# Patient Record
Sex: Female | Born: 2000 | Race: Black or African American | Hispanic: No | Marital: Single | Smoking: Never smoker
Health system: Southern US, Community
[De-identification: ages and names within clinical notes are randomized; demographics above are authoritative.]

## PROBLEM LIST (undated history)

## (undated) DIAGNOSIS — E669 Obesity, unspecified: Secondary | ICD-10-CM

## (undated) DIAGNOSIS — F909 Attention-deficit hyperactivity disorder, unspecified type: Secondary | ICD-10-CM

## (undated) DIAGNOSIS — R011 Cardiac murmur, unspecified: Secondary | ICD-10-CM

## (undated) DIAGNOSIS — T7840XA Allergy, unspecified, initial encounter: Secondary | ICD-10-CM

## (undated) DIAGNOSIS — L309 Dermatitis, unspecified: Secondary | ICD-10-CM

## (undated) HISTORY — PX: OTHER SURGICAL HISTORY: SHX169

## (undated) HISTORY — PX: ADENOIDECTOMY: SUR15

## (undated) HISTORY — PX: TONSILLECTOMY: SUR1361

## (undated) HISTORY — PX: INNER EAR SURGERY: SHX679

---
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
## 2021-01-03 ENCOUNTER — Emergency Department
Admission: EM | Admit: 2021-01-03 | Discharge: 2021-01-03 | Disposition: A | Discharge status: Home / Self Care | Payer: Medicaid Other | Attending: Emergency Medicine | Admitting: Emergency Medicine

## 2021-01-03 ENCOUNTER — Other Ambulatory Visit: Payer: Self-pay

## 2021-01-03 ENCOUNTER — Encounter: Payer: Self-pay | Admitting: Emergency Medicine

## 2021-01-03 DIAGNOSIS — H60333 Swimmer's ear, bilateral: Secondary | ICD-10-CM | POA: Insufficient documentation

## 2021-01-03 DIAGNOSIS — H9203 Otalgia, bilateral: Secondary | ICD-10-CM | POA: Diagnosis present

## 2021-01-03 DIAGNOSIS — H66011 Acute suppurative otitis media with spontaneous rupture of ear drum, right ear: Secondary | ICD-10-CM | POA: Diagnosis not present

## 2021-01-03 DIAGNOSIS — Z79899 Other long term (current) drug therapy: Secondary | ICD-10-CM | POA: Diagnosis not present

## 2021-01-03 DIAGNOSIS — I10 Essential (primary) hypertension: Secondary | ICD-10-CM | POA: Diagnosis not present

## 2021-01-03 DIAGNOSIS — J029 Acute pharyngitis, unspecified: Secondary | ICD-10-CM | POA: Diagnosis not present

## 2021-01-03 MED ORDER — CIPROFLOXACIN-DEXAMETHASONE 0.3-0.1 % OT SUSP: 4.0000 [drp] | Freq: Two times a day (BID) | OTIC | 0 refills | Status: DC

## 2021-01-03 MED ORDER — DEXAMETHASONE 6 MG PO TABS
10.0000 mg | ORAL_TABLET | Freq: Once | ORAL | Status: AC
  Administered 2021-01-03: 10 mg via ORAL
  Filled 2021-01-03: qty 1

## 2021-01-03 MED ORDER — CIPROFLOXACIN-DEXAMETHASONE 0.3-0.1 % OT SUSP: 4.0000 [drp] | Freq: Once | OTIC | Status: DC

## 2021-01-03 MED ORDER — AMOXICILLIN-POT CLAVULANATE 875-125 MG PO TABS: 1.0000 | ORAL_TABLET | Freq: Two times a day (BID) | ORAL | 0 refills | Status: AC

## 2021-01-03 MED ORDER — AMOXICILLIN-POT CLAVULANATE 875-125 MG PO TABS: 1.0000 | ORAL_TABLET | Freq: Two times a day (BID) | ORAL | 0 refills | Status: DC

## 2021-01-03 MED ORDER — CIPROFLOXACIN-DEXAMETHASONE 0.3-0.1 % OT SUSP: 4.0000 [drp] | Freq: Two times a day (BID) | OTIC | 0 refills | Status: AC

## 2021-01-03 MED ORDER — IBUPROFEN 600 MG PO TABS: 600.0000 mg | ORAL_TABLET | Freq: Three times a day (TID) | ORAL | 0 refills | Status: DC | PRN

## 2021-01-03 MED ORDER — IBUPROFEN 600 MG PO TABS: 600.0000 mg | ORAL_TABLET | Freq: Three times a day (TID) | ORAL | 0 refills | Status: AC | PRN

## 2021-01-03 NOTE — ED Provider Notes (Signed)
[facility] Emergency Department Provider Note  ____________________________________________   Event Date/Time   First MD Initiated Contact with Patient 01/03/21 1553     (approximate)  I have reviewed the triage vital signs and the nursing notes.   HISTORY  Chief Complaint Sore Throat, Cough, and Fever    HPI Nicole Cummings is a 20 y.o. female with history of recurrent ear infections here with multiple complaints.  The patient states that for the last several days, she has had sore throat, bilateral ear pain, and some drainage from her right ear.  She said associated cough with occasional yellow-green sputum production.  No shortness of breath.  No fevers.  She states that she has history of recurrent ear infections, and her current ear pressure and drainage feels similar to this.  She has also noticed sore throat and pain with swallowing.  No difficulty swallowing.  She has had a tonsillectomy.  Denies any neck pain or neck stiffness.  No headache or photophobia.    Past Medical History:  Diagnosis Date   ADHD (attention deficit hyperactivity disorder)    Allergy    Eczema    Heart murmur    h/o   Obesity     Patient Active Problem List   Diagnosis Date Noted   Major depressive disorder, recurrent severe without psychotic features (HCC) 12/04/2020   Non-suicidal self harm 01/14/2019   Self-cutting of wrist (HCC)    Suicidal ideation    Essential hypertension 01/19/2015    Past Surgical History:  Procedure Laterality Date   ADENOIDECTOMY     INNER EAR SURGERY     TONSILLECTOMY     tubes in ears      Prior to Admission medications   Medication Sig Start Date End Date Taking? Authorizing Provider  albuterol (VENTOLIN HFA) 108 (90 Base) MCG/ACT inhaler Inhale 1-2 puffs into the lungs every 6 (six) hours as needed for wheezing or shortness of breath. 12/06/20   Jesse Sans, MD  amoxicillin-clavulanate (AUGMENTIN) 875-125 MG tablet  Take 1 tablet by mouth 2 (two) times daily for 10 days. 01/03/21 01/13/21  Shaune Pollack, MD  budesonide-formoterol (SYMBICORT) 160-4.5 MCG/ACT inhaler Inhale 2 puffs into the lungs 2 (two) times daily.    [provider]  ciprofloxacin-dexamethasone (CIPRODEX) OTIC suspension Place 4 drops into both ears 2 (two) times daily for 7 days. 01/03/21 01/10/21  Shaune Pollack, MD  escitalopram (LEXAPRO) 5 MG tablet Take 1 tablet (5 mg total) by mouth at bedtime. 12/06/20   Jesse Sans, MD  ibuprofen (ADVIL) 600 MG tablet Take 1 tablet (600 mg total) by mouth every 8 (eight) hours as needed for moderate pain. 01/03/21   Shaune Pollack, MD  lisinopril (ZESTRIL) 20 MG tablet Take 1 tablet (20 mg total) by mouth every morning. 12/06/20   Jesse Sans, MD    Allergies Shellfish allergy  Family History  Problem Relation Age of Onset   Diabetes Mother    Hypertension Mother     Social History Social History   Tobacco Use   Smoking status: Never   Smokeless tobacco: Never  Vaping Use   Vaping Use: Never used  Substance Use Topics   Alcohol use: Yes   Drug use: Yes    Types: Marijuana    Review of Systems  Review of Systems  Constitutional:  Positive for fatigue. Negative for fever.  HENT:  Positive for congestion, ear discharge, ear pain and rhinorrhea. Negative for sore throat.  Eyes:  Negative for visual disturbance.  Respiratory:  Positive for cough. Negative for shortness of breath.   Cardiovascular:  Negative for chest pain.  Gastrointestinal:  Negative for abdominal pain, diarrhea, nausea and vomiting.  Genitourinary:  Negative for flank pain.  Musculoskeletal:  Negative for back pain and neck pain.  Skin:  Negative for rash and wound.  Neurological:  Negative for weakness.  All other systems reviewed and are negative.   ____________________________________________  PHYSICAL EXAM:      VITAL SIGNS: ED Triage Vitals [01/03/21 1517]  Enc Vitals Group     BP  129/83     Pulse Rate 69     Resp 20     Temp 98.5 F (36.9 C)     Temp Source Oral     SpO2 100 %     Weight (!) 340 lb (154.2 kg)     Height 5\' 6"  (1.676 m)     Head Circumference      Peak Flow      Pain Score 9     Pain Loc      Pain Edu?      Excl. in [place]?      Physical Exam Vitals and nursing note reviewed.  Constitutional:      General: She is not in acute distress.    Appearance: She is well-developed.  HENT:     Head: Normocephalic and atraumatic.     Comments: Right external canal with yellow-green drainage noted and mild erythema.  The visualized tympanic membrane has a likely small perforation, though there also appears to be air-fluid levels behind this.  Moderate erythema noted.  Left tympanic membranes mildly erythematous but intact, and there is some maceration and erythema of the external canal.  No mastoid tenderness or erythema or asymmetry.  History of pharynx erythematous without significant tonsillar swelling.  Uvula is midline.  Sublingual space is soft.  Neck is supple. Eyes:     Conjunctiva/sclera: Conjunctivae normal.  Cardiovascular:     Rate and Rhythm: Normal rate and regular rhythm.     Heart sounds: Normal heart sounds. No murmur heard.   No friction rub.  Pulmonary:     Effort: Pulmonary effort is normal. No respiratory distress.     Breath sounds: Normal breath sounds. No wheezing or rales.  Chest:     Comments: Clear breath sounds bilaterally with normal work of breathing. Abdominal:     General: There is no distension.     Palpations: Abdomen is soft.     Tenderness: There is no abdominal tenderness.  Musculoskeletal:     Cervical back: Neck supple.  Skin:    General: Skin is warm.     Capillary Refill: Capillary refill takes less than 2 seconds.  Neurological:     Mental Status: She is alert and oriented to person, place, and time.     Motor: No abnormal muscle tone.      ____________________________________________   LABS (all  labs ordered are listed, but only abnormal results are displayed)  Labs Reviewed - No data to display  ____________________________________________  EKG:  ________________________________________  RADIOLOGY All imaging, including plain films, CT scans, and ultrasounds, independently reviewed by me, and interpretations confirmed via formal radiology reads.  ED MD interpretation:     Official radiology report(s): No results found.  ____________________________________________  PROCEDURES   Procedure(s) performed (including Critical Care):  Procedures  ____________________________________________  INITIAL IMPRESSION / MDM / ASSESSMENT AND PLAN / ED COURSE  As part of my medical decision making, I reviewed the following data within the [facility] notes reviewed and incorporated, Old chart reviewed, Notes from prior ED visits, and [place] Controlled Substance Database       *Nicole Cummings was evaluated in Emergency Department on 01/03/2021 for the symptoms described in the history of present illness. She was evaluated in the context of the global COVID-19 pandemic, which necessitated consideration that the patient might be at risk for infection with the SARS-CoV-2 virus that causes COVID-19. Institutional protocols and algorithms that pertain to the evaluation of patients at risk for COVID-19 are in a state of rapid change based on information released by regulatory bodies including the CDC and federal and state organizations. These policies and algorithms were followed during the patient's care in the ED.  Some ED evaluations and interventions may be delayed as a result of limited staffing during the pandemic.*     Medical Decision Making: 20 year old female with history of recurrent ear infections and sinus infections here with several days of nasal congestion, sore throat, and bilateral ear pressure with drainage.  Exam is consistent with likely acute  otitis media of at least the right ear, with possible concomitant otitis externa.  There is also some mild maceration and erythema of the left ear canal and I suspect patient uses Q-tips in her ear canals.  No evidence suggest mastoiditis.  No evidence of meningitis or encephalitis.  She is neurologically intact.  Regarding her sore throat, suspect this is related to likely sinusitis and pharyngitis which is contributed to her ear infection.  She is status post tonsillectomy.  No evidence of uvulitis or peritonsillar abscess.  Neck is supple with no evidence of retropharyngeal abscess.  No stridor.  Patient will be treated with a dose of Decadron, NSAIDs, as well as Augmentin and Ciprodex.  Return precautions given.  ____________________________________________  FINAL CLINICAL IMPRESSION(S) / ED DIAGNOSES  Final diagnoses:  Chronic swimmer's ear of both sides  Non-recurrent acute suppurative otitis media of right ear with spontaneous rupture of tympanic membrane  Pharyngitis, unspecified etiology     MEDICATIONS GIVEN DURING THIS VISIT:  Medications  dexamethasone (DECADRON) tablet 10 mg (has no administration in time range)         Note:  This document was prepared using Dragon voice recognition software and may include unintentional dictation errors.   Duffy Bruce, MD 01/03/21 [phone]

## 2021-01-03 NOTE — ED Triage Notes (Signed)
Pt via POV from home. Pt c/o cough, nasal congestion, fever, and sore throat for the past week. Denies exposure to COVID. Denies being tested recently. Pt is A&Ox4 and NAD.

## 2021-01-03 NOTE — ED Notes (Signed)
Patient given discharge instructions, all questions answered. Patient in possession of all belongings, directed to the discharge area  

## 2022-03-22 IMAGING — CR DG CERVICAL SPINE 2 OR 3 VIEWS
1 series · 4 of 4 positions shown · non-contrast
Comparison: None.

CLINICAL DATA: Increased tingling sensation of the right upper
extremity.

EXAM:
CERVICAL SPINE - 2-3 VIEW

[Series 1: dg cervical spine 2 or 3 views · 0.14mm/px · 4 of 4 slices shown]
[im 1/4]
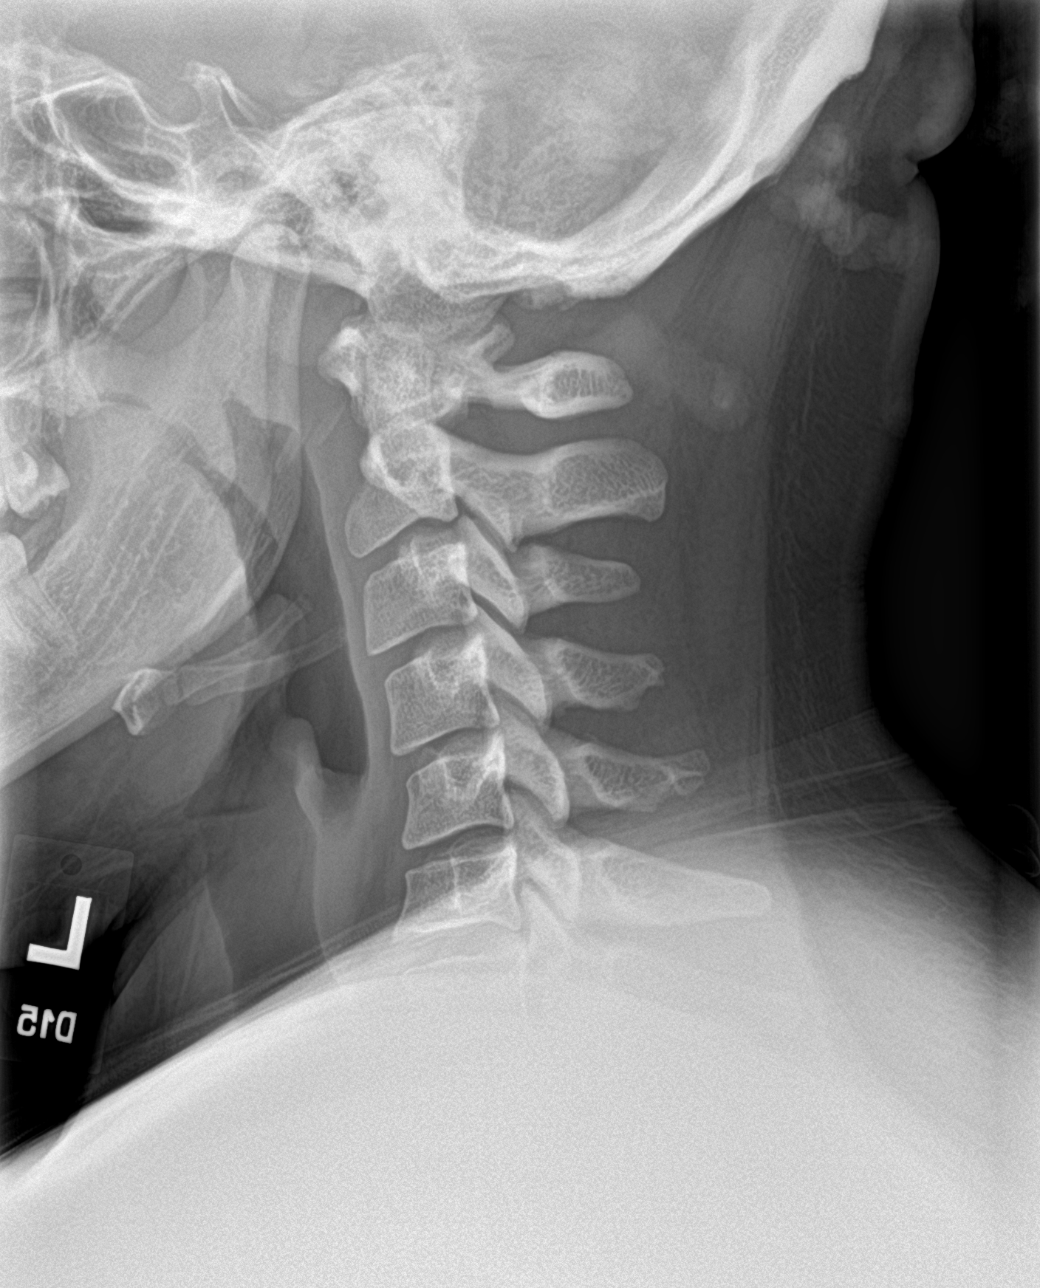
[im 2/4]
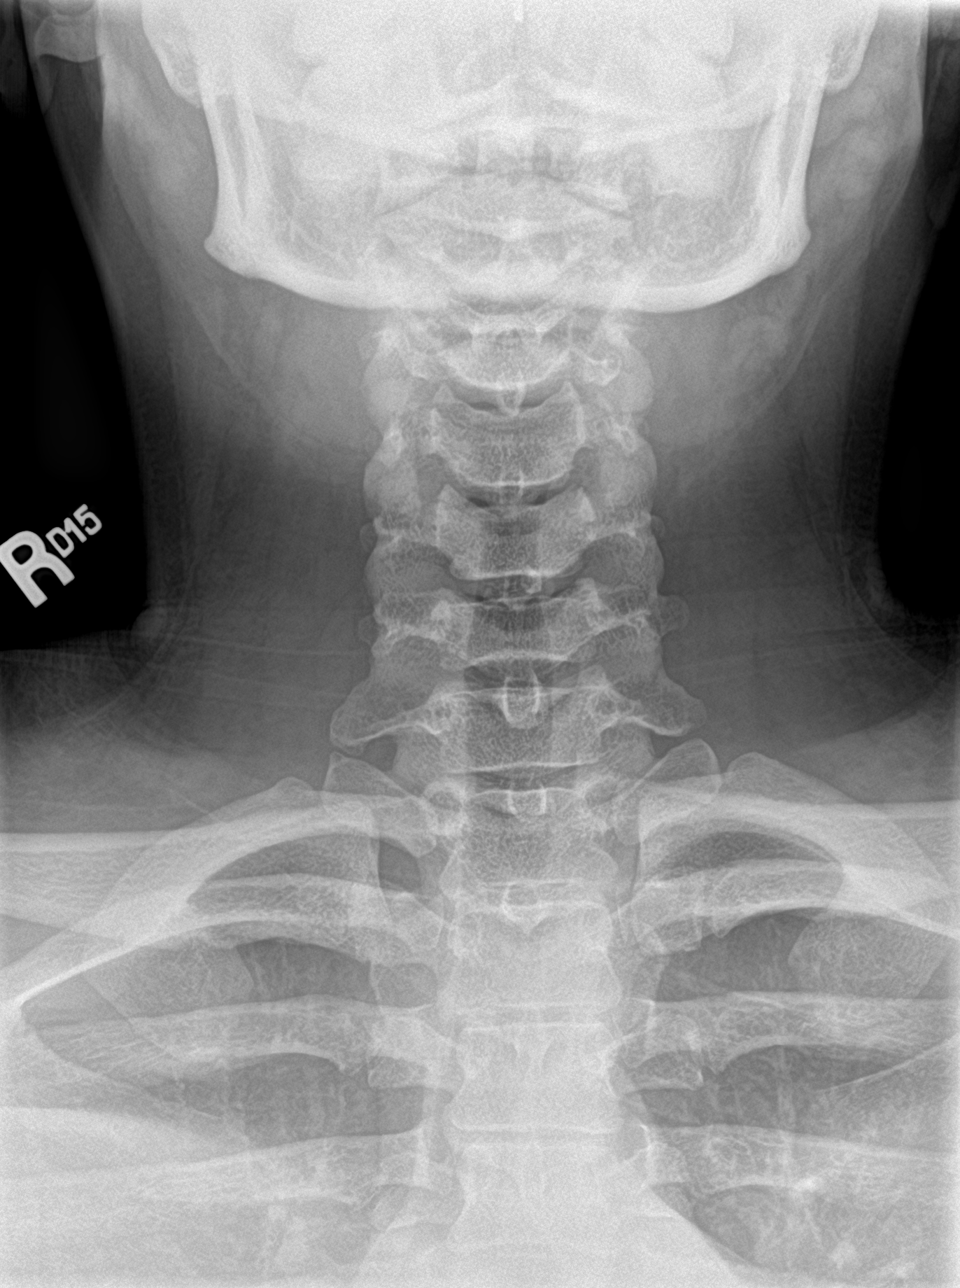
[im 3/4]
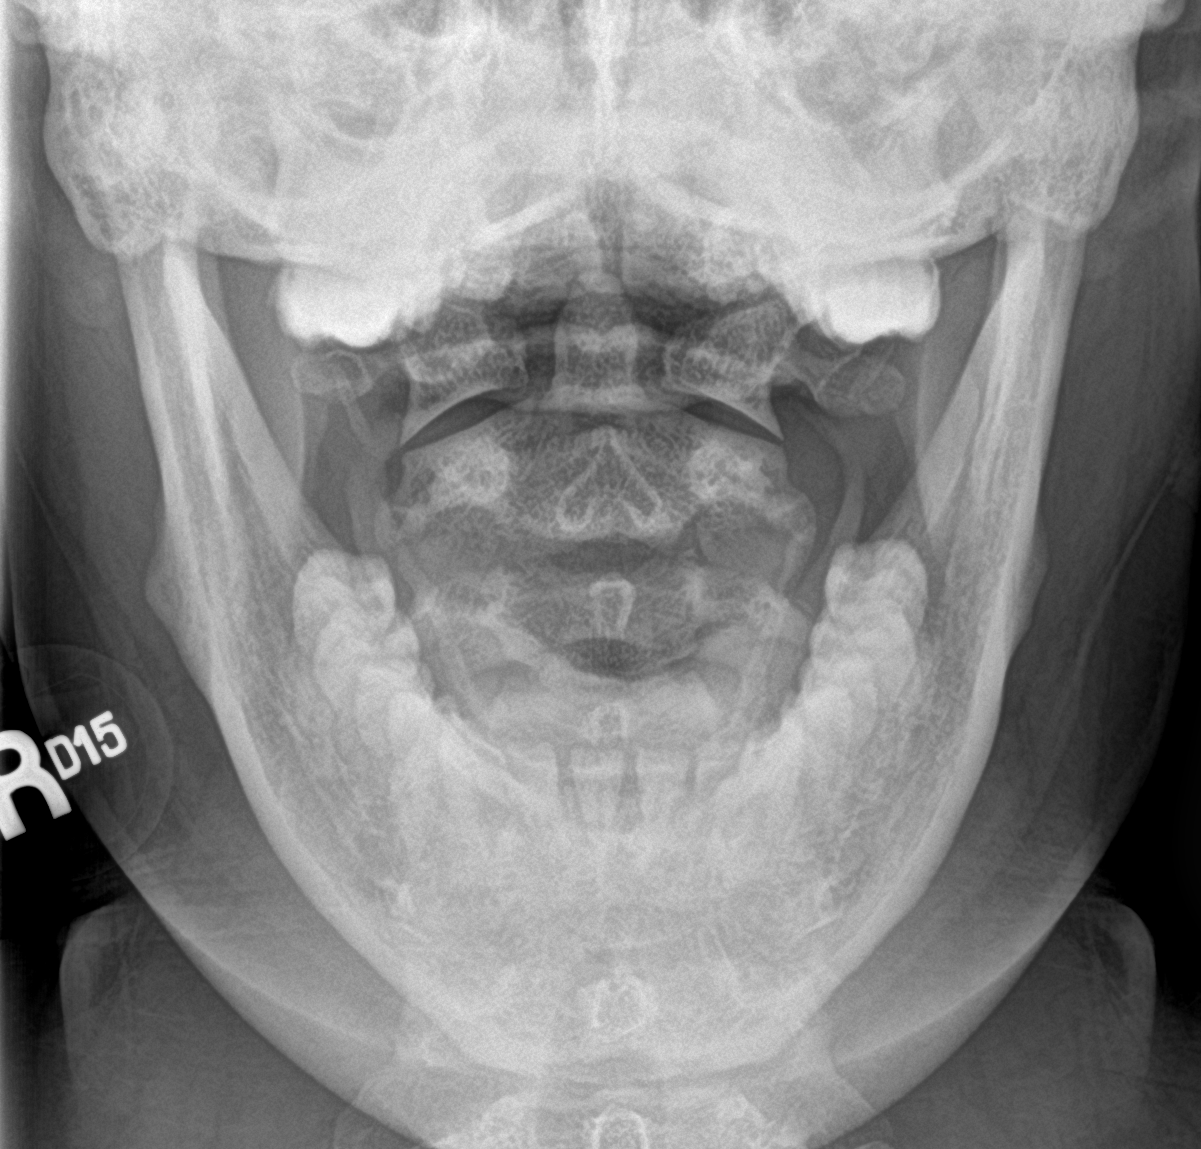
[im 4/4]
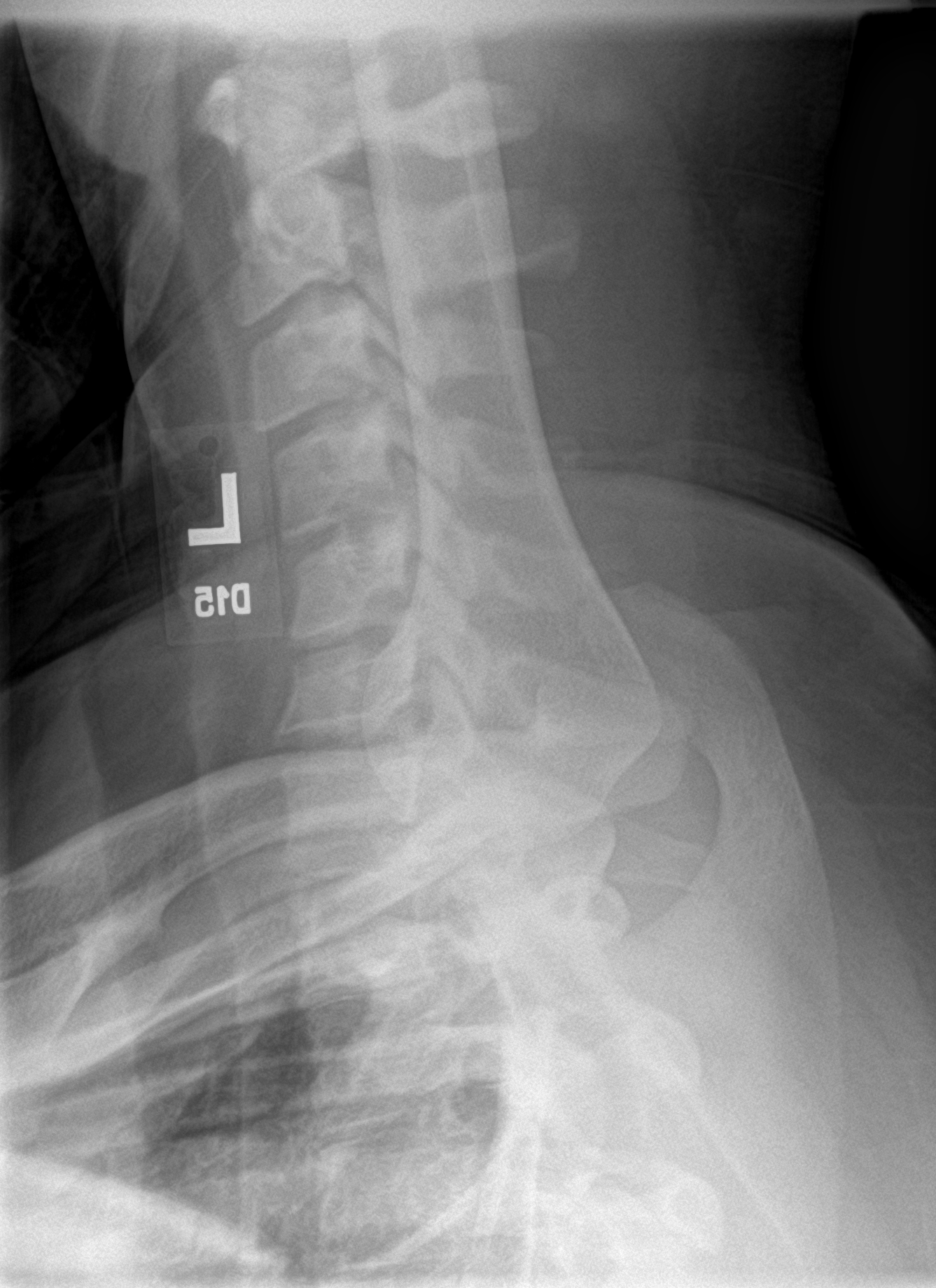

[4 of 4 positions shown; findings below may reference images not displayed]

FINDINGS: Cervical spine is visualized from skull base to the cervicothoracic
junction. No significant listhesis is present. Disc spaces are
maintained. There is straightening of the normal cervical lordosis.
Soft tissues are unremarkable.
IMPRESSION: Negative cervical spine radiographs.

## 2022-10-29 ENCOUNTER — Other Ambulatory Visit: Payer: Self-pay

## 2022-10-29 ENCOUNTER — Emergency Department
Admission: EM | Admit: 2022-10-29 | Discharge: 2022-10-29 | Disposition: A | Discharge status: Home / Self Care | Payer: MEDICAID | Attending: Emergency Medicine | Admitting: Emergency Medicine

## 2022-10-29 DIAGNOSIS — H60503 Unspecified acute noninfective otitis externa, bilateral: Secondary | ICD-10-CM | POA: Insufficient documentation

## 2022-10-29 DIAGNOSIS — R519 Headache, unspecified: Secondary | ICD-10-CM

## 2022-10-29 DIAGNOSIS — I10 Essential (primary) hypertension: Secondary | ICD-10-CM | POA: Diagnosis not present

## 2022-10-29 LAB — CBC
HCT: 35.4 % — ABNORMAL LOW (ref 36.0–46.0)
Hemoglobin: 11 g/dL — ABNORMAL LOW (ref 12.0–15.0)
MCH: 24.6 pg — ABNORMAL LOW (ref 26.0–34.0)
MCHC: 31.1 g/dL (ref 30.0–36.0)
MCV: 79.2 fL — ABNORMAL LOW (ref 80.0–100.0)
Platelets: 239 10*3/uL (ref 150–400)
RBC: 4.47 MIL/uL (ref 3.87–5.11)
RDW: 14.7 % (ref 11.5–15.5)
WBC: 4.4 10*3/uL (ref 4.0–10.5)
nRBC: 0 % (ref 0.0–0.2)

## 2022-10-29 LAB — BASIC METABOLIC PANEL
Anion gap: 6 (ref 5–15)
BUN: 15 mg/dL (ref 6–20)
CO2: 25 mmol/L (ref 22–32)
Calcium: 9 mg/dL (ref 8.9–10.3)
Chloride: 110 mmol/L (ref 98–111)
Creatinine, Ser: 0.65 mg/dL (ref 0.44–1.00)
GFR, Estimated: >60 mL/min (ref >60)
Glucose, Bld: 83 mg/dL (ref 70–99)
Potassium: 3.6 mmol/L (ref 3.5–5.1)
Sodium: 141 mmol/L (ref 135–145)

## 2022-10-29 MED ORDER — METOCLOPRAMIDE HCL 10 MG PO TABS
10.0000 mg | ORAL_TABLET | Freq: Once | ORAL | Status: AC
  Administered 2022-10-29: 10 mg via ORAL
  Filled 2022-10-29: qty 1

## 2022-10-29 MED ORDER — PSEUDOEPHEDRINE HCL 60 MG PO TABS: 60.0000 mg | ORAL_TABLET | Freq: Four times a day (QID) | ORAL | 0 refills | Status: AC | PRN

## 2022-10-29 MED ORDER — NEOMYCIN-POLYMYXIN-HC 3.5-10000-1 OT SOLN: 3.0000 [drp] | Freq: Three times a day (TID) | OTIC | 0 refills | Status: AC

## 2022-10-29 MED ORDER — IBUPROFEN 600 MG PO TABS
600.0000 mg | ORAL_TABLET | Freq: Once | ORAL | Status: AC
  Administered 2022-10-29: 600 mg via ORAL
  Filled 2022-10-29: qty 1

## 2022-10-29 MED ORDER — AMLODIPINE BESYLATE 5 MG PO TABS: 5.0000 mg | ORAL_TABLET | Freq: Every day | ORAL | 1 refills | Status: AC

## 2022-10-29 MED ORDER — PSEUDOEPHEDRINE HCL 30 MG PO TABS
60.0000 mg | ORAL_TABLET | Freq: Once | ORAL | Status: AC
  Administered 2022-10-29: 60 mg via ORAL
  Filled 2022-10-29: qty 2

## 2022-10-29 NOTE — ED Provider Notes (Signed)
[facility] Provider Note    Event Date/Time   First MD Initiated Contact with Patient 10/29/22 1742     (approximate)   History   Headache   HPI  LAKEITA BAK is a 22 y.o. female with a history of possible hypertension and recurrent ear infections who presents with multiple complaints.  She reports drainage from both ears which has been chronic but acutely worsened over the last few days.  Is not associated with significant pain.  She also reports bilateral frontal headache that she woke with.  When she woke up she had some mild blurred vision but this is now resolved.  She also has nasal congestion.  She is concern for elevated blood pressure although states that she does not know what it was at home.  She states a family member checked it and stated it was high.  I reviewed the past medical records.  The patient's most recent prior encounters were in 2022.  She was evaluated by psychiatry in October 2022 for major depressive disorder.  Physical Exam   Triage Vital Signs: ED Triage Vitals  Encounter Vitals Group     BP 10/29/22 1604 (!) 146/94     Systolic BP Percentile --      Diastolic BP Percentile --      Pulse Rate 10/29/22 1603 65     Resp 10/29/22 1603 20     Temp 10/29/22 1606 98.7 F (37.1 C)     Temp src --      SpO2 10/29/22 1603 95 %     Weight 10/29/22 1604 300 lb (136.1 kg)     Height 10/29/22 1624 5\' 6"  (1.676 m)     Head Circumference --      Peak Flow --      Pain Score 10/29/22 1603 10     Pain Loc --      Pain Education --      Exclude from Growth Chart --     Most recent vital signs: Vitals:   10/29/22 1606 10/29/22 1925  BP:  96/75  Pulse:  (!) 56  Resp:  18  Temp: 98.7 F (37.1 C)   SpO2:  100%     General: Alert, well-appearing, no distress.  CV:  Good peripheral perfusion.  Resp:  Normal effort.  Abd:  No distention.  Other:  Bilateral ear canals with some whitish discharge.  TMs appear normal.  Mild  tragal tenderness.  EOMI.  PERRLA.  Oropharynx clear.   ED Results / Procedures / Treatments   Labs (all labs ordered are listed, but only abnormal results are displayed) Labs Reviewed  CBC - Abnormal; Notable for the following components:      Result Value   Hemoglobin 11.0 (*)    HCT 35.4 (*)    MCV 79.2 (*)    MCH 24.6 (*)    All other components within normal limits  BASIC METABOLIC PANEL     EKG   RADIOLOGY   PROCEDURES:  Critical Care performed: No  Procedures   MEDICATIONS ORDERED IN ED: Medications  ibuprofen (ADVIL) tablet 600 mg (600 mg Oral Given 10/29/22 1840)  metoCLOPramide (REGLAN) tablet 10 mg (10 mg Oral Given 10/29/22 1840)  pseudoephedrine (SUDAFED) tablet 60 mg (60 mg Oral Given 10/29/22 1857)     IMPRESSION / MDM / ASSESSMENT AND PLAN / ED COURSE  I reviewed the triage vital signs and the nursing notes.  22 year old female with PMH as noted  above presents with multiple complaints, a headache, discharge from the ears, URI symptoms, as well as concern for elevated blood pressure.  Blood pressure is mildly elevated here and she states that she has had similar elevated readings at home.  Exam is otherwise unremarkable except for whitish discharge to bilateral ear canals and some tragal tenderness.  Differential diagnosis includes, but is not limited to: Otitis externa, viral URI, tension headache, migraine.  Basic labs were obtained from triage and show normal electrolytes, creatinine, and normal WBC count.  Patient's presentation is most consistent with acute, uncomplicated illness.  I have ordered pseudoephedrine, ibuprofen, Reglan for symptomatic treatment for the headache and congestion here.  I have prescribed Cortisporin otic as well for likely otitis externa.  Given the patient's concern for multiple elevated blood pressure readings I have prescribed amlodipine.  However, I advised her to check it again at home and have given her referral to  follow-up with her primary care provider so that this can be followed.  The patient is stable for discharge home.  I counseled her on the results of the workup and plan of care.  I gave her strict return precautions and she expressed understanding.   FINAL CLINICAL IMPRESSION(S) / ED DIAGNOSES   Final diagnoses:  Hypertension, unspecified type  Acute nonintractable headache, unspecified headache type  Acute otitis externa of both ears, unspecified type     Rx / DC Orders   ED Discharge Orders          Ordered    amLODipine (NORVASC) 5 MG tablet  Daily        10/29/22 1909    pseudoephedrine (SUDAFED) 60 MG tablet  Every 6 hours PRN        10/29/22 1909    neomycin-polymyxin-hydrocortisone (CORTISPORIN) OTIC solution  3 times daily        10/29/22 1909             Note:  This document was prepared using Dragon voice recognition software and may include unintentional dictation errors.    Dionne Bucy, MD 10/29/22 [id number]

## 2022-10-29 NOTE — ED Triage Notes (Signed)
Pt to ED [facility] from home for headache started this am. States has been having HTN for awhile, states has been taking some of her moms bp meds, losartan. NAD noted.

## 2022-10-29 NOTE — Discharge Instructions (Addendum)
The pseudoephedrine as this will decrease the congestion and headache.  You may take ibuprofen or Tylenol for the headache as well.  Use the Cortisporin drops for the drainage in your ears.  You should check your blood pressure either at home or at a pharmacy, or with the primary care provider.  However we have started you on amlodipine for treatment of elevated blood pressure.  Return to the ER for new, worsening, or persistent severe headache, ear pain or drainage, fever, weakness, difficulty breathing, or any other new or worsening symptoms that concern you.  You can call either [facility] or [facility] to arrange for follow-up with a primary care provider.

## 2023-01-16 ENCOUNTER — Emergency Department: Payer: MEDICAID

## 2023-01-16 ENCOUNTER — Emergency Department
Admission: EM | Admit: 2023-01-16 | Discharge: 2023-01-16 | Disposition: A | Discharge status: Home / Self Care | Payer: MEDICAID | Attending: Emergency Medicine | Admitting: Emergency Medicine

## 2023-01-16 DIAGNOSIS — S20212A Contusion of left front wall of thorax, initial encounter: Secondary | ICD-10-CM

## 2023-01-16 DIAGNOSIS — S2232XA Fracture of one rib, left side, initial encounter for closed fracture: Secondary | ICD-10-CM

## 2023-01-16 DIAGNOSIS — W208XXA Other cause of strike by thrown, projected or falling object, initial encounter: Secondary | ICD-10-CM | POA: Diagnosis not present

## 2023-01-16 DIAGNOSIS — S299XXA Unspecified injury of thorax, initial encounter: Secondary | ICD-10-CM | POA: Diagnosis present

## 2023-01-16 MED ORDER — LIDOCAINE 5 % EX PTCH
1.0000 | MEDICATED_PATCH | Freq: Once | CUTANEOUS | Status: DC
  Administered 2023-01-16: 1 via TRANSDERMAL
  Filled 2023-01-16: qty 1

## 2023-01-16 MED ORDER — LIDOCAINE 5 % EX PTCH: 1.0000 | MEDICATED_PATCH | Freq: Two times a day (BID) | CUTANEOUS | 0 refills | Status: AC | PRN

## 2023-01-16 MED ORDER — CYCLOBENZAPRINE HCL 5 MG PO TABS: 5.0000 mg | ORAL_TABLET | Freq: Three times a day (TID) | ORAL | 0 refills | Status: AC | PRN

## 2023-01-16 NOTE — Discharge Instructions (Addendum)
Your exam and XR shows a possible single rib fracture. Take the prescription muscle relaxant as needed. Take OTC ibuprofen and Tylenol for additional, non-drowsy pain relief. Apply ice or moist heat to reduce symptoms.

## 2023-01-16 NOTE — ED Triage Notes (Signed)
Pt states that she was helping a friend move yesterday and had a box fall on her L rib cage/back area. Denies head injury/LOC.

## 2023-01-18 NOTE — ED Provider Notes (Signed)
[facility] Emergency Department Provider Note     Event Date/Time   First MD Initiated Contact with Patient 01/16/23 2105     (approximate)   History   Rib Injury   HPI  Nicole Cummings is a 22 y.o. female with a noncontributory medical history, presents to the ED for evaluation of pain to the left chest wall.  She was helping a friend move yesterday, when a large box fell onto her rib and chest region.  She presents to the ED noting some mild chest wall tenderness on palpation.  No cough or hemoptysis reported.  No shortness of breath, head injury, LOC is noted.  Physical Exam   Triage Vital Signs: ED Triage Vitals [01/16/23 1754]  Encounter Vitals Group     BP (!) 160/105     Systolic BP Percentile      Diastolic BP Percentile      Pulse Rate 91     Resp 16     Temp 98.1 F (36.7 C)     Temp Source Oral     SpO2 97 %     Weight      Height      Head Circumference      Peak Flow      Pain Score 10     Pain Loc      Pain Education      Exclude from Growth Chart     Most recent vital signs: Vitals:   01/16/23 1754 01/16/23 2219  BP: (!) 160/105 (!) 164/75  Pulse: 91   Resp: 16   Temp: 98.1 F (36.7 C)   SpO2: 97%     General Awake, no distress. NAD HEENT NCAT. PERRL. EOMI. No rhinorrhea. Mucous membranes are moist.  CV:  Good peripheral perfusion. RRR RESP:  Normal effort.  CTA.  No obvious chest wall deformity or paroxysmal movement.  Lungs are clear. ABD:  No distention.  MSK:  Normal spinal alignment without midline tenderness, spasm, deformity, or step-off.   ED Results / Procedures / Treatments   Labs (all labs ordered are listed, but only abnormal results are displayed) Labs Reviewed - No data to display   EKG   RADIOLOGY  I personally viewed and evaluated these images as part of my medical decision making, as well as reviewing the written report by the radiologist.  ED Provider Interpretation: No obvious  rib fracture noted  DG CXR w/ Left Ribs  IMPRESSION: Possible acute nondisplaced left tenth anterolateral rib fracture.  PROCEDURES:  Critical Care performed: No  Procedures   MEDICATIONS ORDERED IN ED: Medications - No data to display   IMPRESSION / MDM / ASSESSMENT AND PLAN / ED COURSE  I reviewed the triage vital signs and the nursing notes.                              Differential diagnosis includes, but is not limited to, CAP, pneumothorax, rib fracture, chest wall contusion  Patient's presentation is most consistent with acute complicated illness / injury requiring diagnostic workup.  Patient's diagnosis is consistent with chest wall contusion and possible left nondisplaced rib fracture.  Patient with otherwise reassuring exam and workup at this time.  No acute respiratory distress is noted.  X-ray reviewed by me does not indicate a fracture, however radiology suspects a possible nondisplaced single rib fracture on the left.  Patient will be given a Lidoderm  patch in the ED.  Patient will be discharged home with prescriptions for Lidoderm and cyclobenzaprine. Patient is to follow up with primary provider as discussed, as needed or otherwise directed. Patient is given ED precautions to return to the ED for any worsening or new symptoms.   FINAL CLINICAL IMPRESSION(S) / ED DIAGNOSES   Final diagnoses:  Chest wall contusion, left, initial encounter  Closed fracture of one rib of left side, initial encounter     Rx / DC Orders   ED Discharge Orders          Ordered    cyclobenzaprine (FLEXERIL) 5 MG tablet  3 times daily PRN        01/16/23 2209    lidocaine (LIDODERM) 5 %  Every 12 hours PRN        01/16/23 2209             Note:  This document was prepared using Dragon voice recognition software and may include unintentional dictation errors.    Lissa Hoard, PA-C 01/18/23 0036    Phineas Semen, MD 01/22/23 [phone]

## 2024-01-06 ENCOUNTER — Emergency Department
Admission: EM | Admit: 2024-01-06 | Discharge: 2024-01-06 | Disposition: A | Discharge status: Home / Self Care | Payer: MEDICAID | Attending: Emergency Medicine | Admitting: Emergency Medicine

## 2024-01-06 ENCOUNTER — Other Ambulatory Visit: Payer: Self-pay

## 2024-01-06 DIAGNOSIS — S39012A Strain of muscle, fascia and tendon of lower back, initial encounter: Secondary | ICD-10-CM | POA: Insufficient documentation

## 2024-01-06 DIAGNOSIS — X58XXXA Exposure to other specified factors, initial encounter: Secondary | ICD-10-CM | POA: Diagnosis not present

## 2024-01-06 DIAGNOSIS — R1110 Vomiting, unspecified: Secondary | ICD-10-CM | POA: Insufficient documentation

## 2024-01-06 DIAGNOSIS — S3992XA Unspecified injury of lower back, initial encounter: Secondary | ICD-10-CM | POA: Diagnosis present

## 2024-01-06 MED ORDER — NAPROXEN 500 MG PO TABS: 500.0000 mg | ORAL_TABLET | Freq: Two times a day (BID) | ORAL | 2 refills | Status: AC

## 2024-01-06 MED ORDER — KETOROLAC TROMETHAMINE 30 MG/ML IJ SOLN
30.0000 mg | Freq: Once | INTRAMUSCULAR | Status: AC
  Administered 2024-01-06: 30 mg via INTRAVENOUS
  Filled 2024-01-06: qty 1

## 2024-01-06 MED ORDER — ONDANSETRON 4 MG PO TBDP: 4.0000 mg | ORAL_TABLET | Freq: Three times a day (TID) | ORAL | 0 refills | Status: AC | PRN

## 2024-01-06 NOTE — ED Notes (Signed)
 See triage note  Presents with some lower back pain  States she developed some n/v  thinks that she vomited so hard that she developed lower back pain

## 2024-01-06 NOTE — ED Triage Notes (Signed)
 Pt presents to the ED via [facility] from home. Pt is on her menstrual cycle. Pt typically has nausea and vomiting when on her menstrual cycle. Reports vomiting today and pulling a muscle in her back. Pt called EMS for the back pain. Pt reports 10/10 lower midline back pain.  4mg  zofran given by EMS.   125/55 100% RA HR 65 CBG 85

## 2024-01-06 NOTE — ED Provider Notes (Signed)
[facility] Provider Note    Event Date/Time   First MD Initiated Contact with Patient 01/06/24 [phone]     (approximate)   History   Back Pain   HPI  Nicole Cummings is a 23 y.o. female with history of ADHD who presents with complaints of back pain.  Patient reports she was vomiting this morning because of her menstrual cycle, she reports this is common for her.  She reports when she was vomiting she felt a spasm in her back so she has presented to the emergency department.  No difficulty ambulating.  No numbness weakness loss of bowel or bladder continence     Physical Exam   Triage Vital Signs: ED Triage Vitals [01/06/24 0943]  Encounter Vitals Group     BP 126/63     Girls Systolic BP Percentile      Girls Diastolic BP Percentile      Boys Systolic BP Percentile      Boys Diastolic BP Percentile      Pulse Rate 62     Resp 18     Temp 98.1 F (36.7 C)     Temp Source Oral     SpO2 96 %     Weight 136.1 kg (300 lb)     Height 1.727 m (5' 8)     Head Circumference      Peak Flow      Pain Score 10     Pain Loc      Pain Education      Exclude from Growth Chart     Most recent vital signs: Vitals:   01/06/24 0943  BP: 126/63  Pulse: 62  Resp: 18  Temp: 98.1 F (36.7 C)  SpO2: 96%     General: Awake, no distress.  CV:  Good peripheral perfusion.  Resp:  Normal effort.  Abd:  No distention.  Other:  Mild paralumbar tenderness to palpation suspect muscle spasm, normal strength in lower extremities, normal sensation warm and well-perfused distally   ED Results / Procedures / Treatments   Labs (all labs ordered are listed, but only abnormal results are displayed) Labs Reviewed - No data to display   EKG     RADIOLOGY     PROCEDURES:  Critical Care performed:   Procedures   MEDICATIONS ORDERED IN ED: Medications  ketorolac (TORADOL) 30 MG/ML injection 30 mg (has no administration in time range)      IMPRESSION / MDM / ASSESSMENT AND PLAN / ED COURSE  I reviewed the triage vital signs and the nursing notes. Patient's presentation is most consistent with acute, uncomplicated illness.  Patient overall well-appearing and in no acute distress, vital signs reassuring, exam unremarkable, consistent with muscle spasm/strain, will treat with a dose of IV Toradol, prescription for naproxen , no indication for further workup at this time, outpatient follow-up as needed.        FINAL CLINICAL IMPRESSION(S) / ED DIAGNOSES   Final diagnoses:  Strain of lumbar region, initial encounter     Rx / DC Orders   ED Discharge Orders          Ordered    naproxen  (NAPROSYN ) 500 MG tablet  2 times daily with meals        01/06/24 0955             Note:  This document was prepared using Dragon voice recognition software and may include unintentional dictation errors.   Arlander Charleston, MD 01/06/24  0959  

## 2024-03-31 ENCOUNTER — Encounter: Payer: Self-pay | Admitting: Emergency Medicine

## 2024-03-31 ENCOUNTER — Emergency Department
Admission: EM | Admit: 2024-03-31 | Discharge: 2024-03-31 | Disposition: A | Discharge status: Home / Self Care | Payer: MEDICAID | Attending: Emergency Medicine | Admitting: Emergency Medicine

## 2024-03-31 ENCOUNTER — Other Ambulatory Visit: Payer: Self-pay

## 2024-03-31 ENCOUNTER — Emergency Department: Payer: MEDICAID

## 2024-03-31 DIAGNOSIS — R112 Nausea with vomiting, unspecified: Secondary | ICD-10-CM | POA: Diagnosis present

## 2024-03-31 DIAGNOSIS — R059 Cough, unspecified: Secondary | ICD-10-CM | POA: Diagnosis not present

## 2024-03-31 DIAGNOSIS — R0600 Dyspnea, unspecified: Secondary | ICD-10-CM | POA: Insufficient documentation

## 2024-03-31 DIAGNOSIS — R1084 Generalized abdominal pain: Secondary | ICD-10-CM | POA: Diagnosis not present

## 2024-03-31 LAB — CBC WITH DIFFERENTIAL/PLATELET
Abs Immature Granulocytes: 0.03 10*3/uL (ref 0.00–0.07)
Basophils Absolute: 0 10*3/uL (ref 0.0–0.1)
Basophils Relative: 0 %
Eosinophils Absolute: 0.1 10*3/uL (ref 0.0–0.5)
Eosinophils Relative: 1 %
HCT: 40.7 % (ref 36.0–46.0)
Hemoglobin: 12.6 g/dL (ref 12.0–15.0)
Immature Granulocytes: 1 %
Lymphocytes Relative: 21 %
Lymphs Abs: 1.2 10*3/uL (ref 0.7–4.0)
MCH: 25 pg — ABNORMAL LOW (ref 26.0–34.0)
MCHC: 31 g/dL (ref 30.0–36.0)
MCV: 80.8 fL (ref 80.0–100.0)
Monocytes Absolute: 0.3 10*3/uL (ref 0.1–1.0)
Monocytes Relative: 5 %
Neutro Abs: 4.1 10*3/uL (ref 1.7–7.7)
Neutrophils Relative %: 72 %
Platelets: 253 10*3/uL (ref 150–400)
RBC: 5.04 MIL/uL (ref 3.87–5.11)
RDW: 15 % (ref 11.5–15.5)
WBC: 5.6 10*3/uL (ref 4.0–10.5)
nRBC: 0 % (ref 0.0–0.2)

## 2024-03-31 LAB — BASIC METABOLIC PANEL WITH GFR
Anion gap: 11 (ref 5–15)
BUN: 8 mg/dL (ref 6–20)
CO2: 23 mmol/L (ref 22–32)
Calcium: 9.4 mg/dL (ref 8.9–10.3)
Chloride: 109 mmol/L (ref 98–111)
Creatinine, Ser: 0.6 mg/dL (ref 0.44–1.00)
GFR, Estimated: >60 mL/min
Glucose, Bld: 112 mg/dL — ABNORMAL HIGH (ref 70–99)
Potassium: 3.4 mmol/L — ABNORMAL LOW (ref 3.5–5.1)
Sodium: 143 mmol/L (ref 135–145)

## 2024-03-31 LAB — PREGNANCY, URINE: Preg Test, Ur: NEGATIVE

## 2024-03-31 LAB — RESP PANEL BY RT-PCR (RSV, FLU A&B, COVID)  RVPGX2
Influenza A by PCR: NEGATIVE
Influenza B by PCR: NEGATIVE
Resp Syncytial Virus by PCR: NEGATIVE
SARS Coronavirus 2 by RT PCR: NEGATIVE

## 2024-03-31 LAB — URINALYSIS, ROUTINE W REFLEX MICROSCOPIC
Bilirubin Urine: NEGATIVE
Glucose, UA: NEGATIVE mg/dL
Ketones, ur: 20 mg/dL — AB
Leukocytes,Ua: NEGATIVE
Nitrite: NEGATIVE
Protein, ur: NEGATIVE mg/dL
RBC / HPF: >50 RBC/hpf (ref 0–5)
Specific Gravity, Urine: 1.021 (ref 1.005–1.030)
pH: 7 (ref 5.0–8.0)

## 2024-03-31 LAB — LIPASE, BLOOD: Lipase: 14 U/L (ref 11–51)

## 2024-03-31 MED ORDER — ALUM & MAG HYDROXIDE-SIMETH 200-200-20 MG/5ML PO SUSP
30.0000 mL | Freq: Once | ORAL | Status: AC
  Administered 2024-03-31: 30 mL via ORAL
  Filled 2024-03-31: qty 30

## 2024-03-31 MED ORDER — ONDANSETRON 4 MG PO TBDP: 4.0000 mg | ORAL_TABLET | Freq: Three times a day (TID) | ORAL | 0 refills | Status: AC | PRN

## 2024-03-31 MED ORDER — ONDANSETRON HCL 4 MG/2ML IJ SOLN
4.0000 mg | Freq: Once | INTRAMUSCULAR | Status: AC
  Administered 2024-03-31: 4 mg via INTRAVENOUS
  Filled 2024-03-31: qty 2

## 2024-03-31 MED ORDER — ONDANSETRON 4 MG PO TBDP
4.0000 mg | ORAL_TABLET | Freq: Once | ORAL | Status: AC
  Administered 2024-03-31: 4 mg via ORAL
  Filled 2024-03-31: qty 1

## 2024-03-31 MED ORDER — KETOROLAC TROMETHAMINE 15 MG/ML IJ SOLN
15.0000 mg | Freq: Once | INTRAMUSCULAR | Status: AC
  Administered 2024-03-31: 15 mg via INTRAVENOUS
  Filled 2024-03-31: qty 1

## 2024-03-31 MED ORDER — SODIUM CHLORIDE 0.9 % IV BOLUS
1000.0000 mL | Freq: Once | INTRAVENOUS | Status: AC
  Administered 2024-03-31: 1000 mL via INTRAVENOUS

## 2024-03-31 MED ORDER — PANTOPRAZOLE SODIUM 40 MG IV SOLR
40.0000 mg | Freq: Once | INTRAVENOUS | Status: AC
  Administered 2024-03-31: 40 mg via INTRAVENOUS
  Filled 2024-03-31: qty 10

## 2024-03-31 NOTE — ED Triage Notes (Signed)
 Pt arrives via EMS from home c/o n/v x12 hours. Pt says she started her menstrual cycle yesterday and that is when her sx started. Pt reports hx of same.   CBG 91

## 2024-03-31 NOTE — Discharge Instructions (Addendum)
 You were evaluated in the ED for nausea and vomiting.  Your lab work is reassuring.  Your urinalysis is normal.  Your chest x-ray is normal.  Your EKG is reassuring.  Please follow-up with your primary care provider in 3 days.  If any new or worsening symptoms, please return to the ED for further evaluation.  Pain control:  Ibuprofen  (motrin /aleve /advil ) - You can take 3 tablets (600 mg) every 6 hours as needed for pain/fever.  Acetaminophen  (tylenol ) - You can take 2 extra strength tablets (1000 mg) every 6 hours as needed for pain/fever.  You can alternate these medications or take them together.  Make sure you eat food/drink water when taking these medications.

## 2024-03-31 NOTE — ED Provider Notes (Signed)
 "   [facility] Emergency Department Provider Note     Event Date/Time   First MD Initiated Contact with Patient 03/31/24 1510     (approximate)   History   Emesis   HPI  Nicole Cummings is a 24 y.o. female with a past medical history of eczema, ADHD, allergies and obesity presents to the ED for evaluation of nausea and vomiting over the past 12 hours.  Patient reports her menstrual cycle started yesterday and since she has been vomiting nonstop and unable to keep any food or fluids down.  She reports having a history of this occurring around her menstrual cycle however today symptoms are more severe.  She endorses generalized abdominal pain more prominent in her upper abdomen that radiates up to her chest.  She endorses difficulty breathing.  She denies any fever, recent illnesses, urinary symptoms or bowel changes.  No sick contacts.     Physical Exam   Triage Vital Signs: ED Triage Vitals  Encounter Vitals Group     BP 03/31/24 1408 (!) 117/96     Girls Systolic BP Percentile --      Girls Diastolic BP Percentile --      Boys Systolic BP Percentile --      Boys Diastolic BP Percentile --      Pulse Rate 03/31/24 1404 72     Resp 03/31/24 1404 20     Temp 03/31/24 1404 98.4 F (36.9 C)     Temp Source 03/31/24 1404 Oral     SpO2 03/31/24 1404 96 %     Weight 03/31/24 1404 (!) 308 lb (139.7 kg)     Height 03/31/24 1404 5' 8 (1.727 m)     Head Circumference --      Peak Flow --      Pain Score 03/31/24 1404 3     Pain Loc --      Pain Education --      Exclude from Growth Chart --     Most recent vital signs: Vitals:   03/31/24 1408 03/31/24 1852  BP: (!) 117/96 120/70  Pulse:  70  Resp:  18  Temp:  98 F (36.7 C)  SpO2:  97%    General Awake, no distress.  Tearful.  Nontoxic-appearing. HEENT NCAT.  CV:  Good peripheral perfusion.  RRR.  Radial pulses palpable and symmetric bilaterally 2 + RESP:  Normal effort.  LCTAB ABD:  No  distention.  Soft, nontender.     ED Results / Procedures / Treatments   Labs (all labs ordered are listed, but only abnormal results are displayed) Labs Reviewed  CBC WITH DIFFERENTIAL/PLATELET - Abnormal; Notable for the following components:      Result Value   MCH 25.0 (*)    All other components within normal limits  BASIC METABOLIC PANEL WITH GFR - Abnormal; Notable for the following components:   Potassium 3.4 (*)    Glucose, Bld 112 (*)    All other components within normal limits  URINALYSIS, ROUTINE W REFLEX MICROSCOPIC - Abnormal; Notable for the following components:   Color, Urine YELLOW (*)    APPearance CLEAR (*)    Hgb urine dipstick LARGE (*)    Ketones, ur 20 (*)    Bacteria, UA RARE (*)    All other components within normal limits  RESP PANEL BY RT-PCR (RSV, FLU A&B, COVID)  RVPGX2  LIPASE, BLOOD  PREGNANCY, URINE    EKG  Sinus bradycardia with  sinus arrhythmia  DG Chest 1 View Result Date: 03/31/2024 CLINICAL DATA:  Cough EXAM: CHEST  1 VIEW COMPARISON:  01/16/2023 FINDINGS: Stable cardiac enlargement without focal pneumonia, edema pattern, CHF, large effusion or pneumothorax. Trachea midline. No osseous abnormality. IMPRESSION: Cardiomegaly without acute process. Electronically Signed   By: CHRISTELLA.  Shick M.D.   On: 03/31/2024 16:54    PROCEDURES:  Critical Care performed: No  Procedures   MEDICATIONS ORDERED IN ED: Medications  ondansetron  (ZOFRAN -ODT) disintegrating tablet 4 mg (4 mg Oral Given 03/31/24 1408)  sodium chloride  0.9 % bolus 1,000 mL (0 mLs Intravenous Stopped 03/31/24 1851)  alum & mag hydroxide-simeth (MAALOX/MYLANTA) 200-200-20 MG/5ML suspension 30 mL (30 mLs Oral Given 03/31/24 1551)  pantoprazole  (PROTONIX ) injection 40 mg (40 mg Intravenous Given 03/31/24 1551)  ondansetron  (ZOFRAN ) injection 4 mg (4 mg Intravenous Given 03/31/24 1626)  ketorolac  (TORADOL ) 15 MG/ML injection 15 mg (15 mg Intravenous Given 03/31/24 1723)   IMPRESSION /  MDM / ASSESSMENT AND PLAN / ED COURSE  I reviewed the triage vital signs and the nursing notes.                             Clinical Course as of 03/31/24 1909  Tue Mar 31, 2024  1610 ED EKG Sinus bradycardia with sinus arrhythmia  [MH]  1631 On reassessment, patient is shivering lying in evaluation bed. She states no known sick contacts, but for the past 2 days has had a nonproductive cough. Will obtain resp panel and chest xray. Low suspicion for PNA , but given history will r/o acute cardiopulmonary etiology [MH]  1654 DG Chest 1 View Stable cardiac enlargement without focal pneumonia, edema pattern, CHF, large effusion or pneumothorax. Trachea midline. No osseous abnormality.   [MH]  1746 Patient is sitting up in evaluation bed.  She reports significant improvement in pain following Toradol .  She is now hungry which is reassuring.  We will p.o. challenge with crackers and IV fluids at bedside and reassess.  I presume if she is able to keep everything down she will be discharged home with antiemetics. [MH]  1836 Urinalysis, Routine w reflex microscopic -Urine, Clean Catch(!) Large Hgb - currently on menstrual - no infectious etiology [MH]    Clinical Course User Index [MH] Margrette, Edessa Jakubowicz A, PA-C   24 y.o. female presents to the emergency department for evaluation and treatment of emesis. See HPI for further details.   Differential diagnosis includes, but is not limited to GERD, viral gastroenteritis, electrolyte abnormality, dysrhythmia, dysmenorrhea, endometriosis  Patient's presentation is most consistent with acute complicated illness / injury requiring diagnostic workup.  Patient is alert and oriented.  She is hemodynamically stable and afebrile.  Physical exam findings are stated above.  Nontender on abdominal exam.  Low suspicion for intra-abdominal pathology.  No indication for further evaluation with imaging at this time.   Labs obtained in triage and are reassuring.  Lipase  normal.  Low suspicion for cardiac etiology.  Given patient's history will obtain EKG.  Plan to obtain urinalysis, check pregnancy status, administer IV fluids and GI cocktail and reassess.  Please see clinical course note.  Patient passed p.o. challenge.  Urinalysis shows no infectious etiology.  Patient's symptoms have significantly improved.  She is in stable condition for discharge home.  Prescription for Zofran  sent to pharmacy.  Advised alternation between Tylenol  and NSAIDs for pain as needed.  ED return precautions were discussed. All questions and concerns  were addressed during this ED visit.     FINAL CLINICAL IMPRESSION(S) / ED DIAGNOSES   Final diagnoses:  Nausea and vomiting, unspecified vomiting type   Rx / DC Orders   ED Discharge Orders          Ordered    ondansetron  (ZOFRAN -ODT) 4 MG disintegrating tablet  Every 8 hours PRN        03/31/24 1839             Note:  This document was prepared using Dragon voice recognition software and may include unintentional dictation errors.    Margrette, Kymberley Raz A, PA-C 03/31/24 1909    Dorothyann Drivers, MD 03/31/24 1915  "
